# Patient Record
Sex: Male | Born: 1990 | Race: White | Hispanic: No | Marital: Single | Smoking: Current every day smoker
Health system: Southern US, Community
[De-identification: ages and names within clinical notes are randomized; demographics above are authoritative.]

## PROBLEM LIST (undated history)

## (undated) DIAGNOSIS — U071 COVID-19: Secondary | ICD-10-CM

## (undated) HISTORY — DX: COVID-19: U07.1

---
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
## 2013-12-01 ENCOUNTER — Emergency Department: Payer: Self-pay | Admitting: Emergency Medicine

## 2014-01-31 ENCOUNTER — Emergency Department: Payer: Self-pay | Admitting: Emergency Medicine

## 2014-01-31 LAB — CBC
HCT: 42.4 % (ref 40.0–52.0)
HGB: 14.3 g/dL (ref 13.0–18.0)
MCH: 31.7 pg (ref 26.0–34.0)
MCHC: 33.6 g/dL (ref 32.0–36.0)
MCV: 94 fL (ref 80–100)
Platelet: 261 10*3/uL (ref 150–440)
RBC: 4.5 10*6/uL (ref 4.40–5.90)
RDW: 12.9 % (ref 11.5–14.5)
WBC: 7 10*3/uL (ref 3.8–10.6)

## 2014-01-31 LAB — COMPREHENSIVE METABOLIC PANEL
ANION GAP: 5 — AB (ref 7–16)
AST: 36 U/L (ref 15–37)
Albumin: 3.9 g/dL (ref 3.4–5.0)
Alkaline Phosphatase: 62 U/L
BILIRUBIN TOTAL: 0.4 mg/dL (ref 0.2–1.0)
BUN: 16 mg/dL (ref 7–18)
CALCIUM: 8 mg/dL — AB (ref 8.5–10.1)
CO2: 26 mmol/L (ref 21–32)
Chloride: 109 mmol/L — ABNORMAL HIGH (ref 98–107)
Creatinine: 1.06 mg/dL (ref 0.60–1.30)
EGFR (African American): >60
EGFR (Non-African Amer.): >60
Glucose: 97 mg/dL (ref 65–99)
OSMOLALITY: 281 (ref 275–301)
POTASSIUM: 3.5 mmol/L (ref 3.5–5.1)
SGPT (ALT): 34 U/L (ref 12–78)
SODIUM: 140 mmol/L (ref 136–145)
Total Protein: 7.3 g/dL (ref 6.4–8.2)

## 2014-01-31 LAB — URINALYSIS, COMPLETE
BACTERIA: NONE SEEN
BILIRUBIN, UR: NEGATIVE
Blood: NEGATIVE
GLUCOSE, UR: NEGATIVE mg/dL (ref 0–75)
KETONE: NEGATIVE
LEUKOCYTE ESTERASE: NEGATIVE
Nitrite: NEGATIVE
Ph: 6 (ref 4.5–8.0)
Protein: NEGATIVE
RBC,UR: NONE SEEN /HPF (ref 0–5)
SPECIFIC GRAVITY: 1.014 (ref 1.003–1.030)
Squamous Epithelial: NONE SEEN
WBC UR: NONE SEEN /HPF (ref 0–5)

## 2014-01-31 LAB — DRUG SCREEN, URINE
Amphetamines, Ur Screen: NEGATIVE (ref ?–1000)
BARBITURATES, UR SCREEN: NEGATIVE (ref ?–200)
BENZODIAZEPINE, UR SCRN: NEGATIVE (ref ?–200)
COCAINE METABOLITE, UR ~~LOC~~: NEGATIVE (ref ?–300)
Cannabinoid 50 Ng, Ur ~~LOC~~: POSITIVE (ref ?–50)
MDMA (Ecstasy)Ur Screen: NEGATIVE (ref ?–500)
Methadone, Ur Screen: NEGATIVE (ref ?–300)
Opiate, Ur Screen: NEGATIVE (ref ?–300)
PHENCYCLIDINE (PCP) UR S: NEGATIVE (ref ?–25)
Tricyclic, Ur Screen: NEGATIVE (ref ?–1000)

## 2014-01-31 LAB — SALICYLATE LEVEL: Salicylates, Serum: <1.7 mg/dL

## 2014-01-31 LAB — ETHANOL
Ethanol %: 0.21 % — ABNORMAL HIGH (ref 0.000–0.080)
Ethanol: 210 mg/dL

## 2014-01-31 LAB — ACETAMINOPHEN LEVEL: Acetaminophen: <2 ug/mL

## 2014-07-24 ENCOUNTER — Emergency Department: Payer: Self-pay | Admitting: Emergency Medicine

## 2014-10-23 ENCOUNTER — Emergency Department: Payer: Self-pay | Admitting: Emergency Medicine

## 2014-10-26 ENCOUNTER — Emergency Department: Payer: Self-pay | Admitting: Emergency Medicine

## 2014-10-26 LAB — CBC WITH DIFFERENTIAL/PLATELET
BASOS PCT: 0.2 %
Basophil #: 0 10*3/uL (ref 0.0–0.1)
EOS PCT: 0.8 %
Eosinophil #: 0.1 10*3/uL (ref 0.0–0.7)
HCT: 43.7 % (ref 40.0–52.0)
HGB: 14.6 g/dL (ref 13.0–18.0)
LYMPHS ABS: 1.6 10*3/uL (ref 1.0–3.6)
LYMPHS PCT: 12.5 %
MCH: 31.3 pg (ref 26.0–34.0)
MCHC: 33.5 g/dL (ref 32.0–36.0)
MCV: 93 fL (ref 80–100)
MONO ABS: 1.5 x10 3/mm — AB (ref 0.2–1.0)
MONOS PCT: 11.7 %
NEUTROS ABS: 9.6 10*3/uL — AB (ref 1.4–6.5)
NEUTROS PCT: 74.8 %
PLATELETS: 360 10*3/uL (ref 150–440)
RBC: 4.68 10*6/uL (ref 4.40–5.90)
RDW: 12.5 % (ref 11.5–14.5)
WBC: 12.8 10*3/uL — ABNORMAL HIGH (ref 3.8–10.6)

## 2014-10-26 LAB — COMPREHENSIVE METABOLIC PANEL
ANION GAP: 7 (ref 7–16)
Albumin: 3.5 g/dL (ref 3.4–5.0)
Alkaline Phosphatase: 161 U/L — ABNORMAL HIGH
BILIRUBIN TOTAL: 0.5 mg/dL (ref 0.2–1.0)
BUN: 13 mg/dL (ref 7–18)
CHLORIDE: 103 mmol/L (ref 98–107)
CO2: 29 mmol/L (ref 21–32)
Calcium, Total: 8.9 mg/dL (ref 8.5–10.1)
Creatinine: 0.99 mg/dL (ref 0.60–1.30)
EGFR (Non-African Amer.): >60
Glucose: 86 mg/dL (ref 65–99)
Osmolality: 277 (ref 275–301)
POTASSIUM: 3.8 mmol/L (ref 3.5–5.1)
SGOT(AST): 48 U/L — ABNORMAL HIGH (ref 15–37)
SGPT (ALT): 83 U/L — ABNORMAL HIGH
Sodium: 139 mmol/L (ref 136–145)
Total Protein: 8.2 g/dL (ref 6.4–8.2)

## 2014-12-20 ENCOUNTER — Emergency Department: Payer: Self-pay | Admitting: Internal Medicine

## 2015-08-23 ENCOUNTER — Encounter: Payer: Self-pay | Admitting: Emergency Medicine

## 2015-08-23 ENCOUNTER — Emergency Department: Payer: Self-pay

## 2015-08-23 ENCOUNTER — Emergency Department
Admission: EM | Admit: 2015-08-23 | Discharge: 2015-08-23 | Disposition: A | Discharge status: Home / Self Care | Payer: Self-pay | Attending: Emergency Medicine | Admitting: Emergency Medicine

## 2015-08-23 DIAGNOSIS — S7011XA Contusion of right thigh, initial encounter: Secondary | ICD-10-CM | POA: Insufficient documentation

## 2015-08-23 DIAGNOSIS — M7918 Myalgia, other site: Secondary | ICD-10-CM

## 2015-08-23 DIAGNOSIS — Y9389 Activity, other specified: Secondary | ICD-10-CM | POA: Insufficient documentation

## 2015-08-23 DIAGNOSIS — S199XXA Unspecified injury of neck, initial encounter: Secondary | ICD-10-CM | POA: Insufficient documentation

## 2015-08-23 DIAGNOSIS — S40021A Contusion of right upper arm, initial encounter: Secondary | ICD-10-CM | POA: Insufficient documentation

## 2015-08-23 DIAGNOSIS — Y998 Other external cause status: Secondary | ICD-10-CM | POA: Insufficient documentation

## 2015-08-23 DIAGNOSIS — S299XXA Unspecified injury of thorax, initial encounter: Secondary | ICD-10-CM | POA: Insufficient documentation

## 2015-08-23 DIAGNOSIS — Z720 Tobacco use: Secondary | ICD-10-CM | POA: Insufficient documentation

## 2015-08-23 DIAGNOSIS — Y92410 Unspecified street and highway as the place of occurrence of the external cause: Secondary | ICD-10-CM | POA: Insufficient documentation

## 2015-08-23 MED ORDER — MELOXICAM 15 MG PO TABS: 15.0000 mg | ORAL_TABLET | Freq: Every day | ORAL | Status: DC

## 2015-08-23 MED ORDER — CYCLOBENZAPRINE HCL 10 MG PO TABS: 10.0000 mg | ORAL_TABLET | Freq: Three times a day (TID) | ORAL | Status: DC | PRN

## 2015-08-23 NOTE — ED Notes (Signed)
AAOx3.  Skin warm and dry.  Moving all extremities equally and strong. Ambulates with easy and steady gait.   
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
## 2015-11-14 ENCOUNTER — Encounter: Payer: Self-pay | Admitting: Emergency Medicine

## 2015-11-14 ENCOUNTER — Emergency Department
Admission: EM | Admit: 2015-11-14 | Discharge: 2015-11-14 | Disposition: A | Discharge status: Home / Self Care | Payer: Self-pay | Attending: Student | Admitting: Student

## 2015-11-14 DIAGNOSIS — J039 Acute tonsillitis, unspecified: Secondary | ICD-10-CM | POA: Insufficient documentation

## 2015-11-14 DIAGNOSIS — Z791 Long term (current) use of non-steroidal anti-inflammatories (NSAID): Secondary | ICD-10-CM | POA: Insufficient documentation

## 2015-11-14 DIAGNOSIS — F17200 Nicotine dependence, unspecified, uncomplicated: Secondary | ICD-10-CM | POA: Insufficient documentation

## 2015-11-14 MED ORDER — PREDNISONE 10 MG PO TABS: 50.0000 mg | ORAL_TABLET | Freq: Every day | ORAL | Status: DC

## 2015-11-14 MED ORDER — ACETAMINOPHEN-CODEINE 120-12 MG/5ML PO SUSP: 10.0000 mL | Freq: Four times a day (QID) | ORAL | Status: AC | PRN

## 2015-11-14 MED ORDER — AMOXICILLIN 500 MG PO TABS: 500.0000 mg | ORAL_TABLET | Freq: Three times a day (TID) | ORAL | Status: DC

## 2015-11-14 NOTE — ED Provider Notes (Signed)
[facility] Emergency Department Provider Note  ____________________________________________  Time seen: Approximately 11:46 AM  I have reviewed the triage vital signs and the nursing notes.   HISTORY  Chief Complaint Sore Throat   HPI Erik Mooney is a 25 y.o. male who presents to the emergency department for evaluation of sore throat. He states symptoms started about 10 days ago. He has been treating it with Benadryl and Dayquil. Yesterday, symptoms worsened and he has had fever and chills. Pain radiates into the right ear. He has a history of peritonsillar abscess that he "didn't get treated."  History reviewed. No pertinent past medical history.  There are no active problems to display for this patient.   History reviewed. No pertinent past surgical history.  Current Outpatient Rx  Name  Route  Sig  Dispense  Refill  . acetaminophen-codeine 120-12 MG/5ML suspension   Oral   Take 10 mLs by mouth every 6 (six) hours as needed for pain.   120 mL   0   . amoxicillin (AMOXIL) 500 MG tablet   Oral   Take 1 tablet (500 mg total) by mouth 3 (three) times daily.   30 tablet   0   . cyclobenzaprine (FLEXERIL) 10 MG tablet   Oral   Take 1 tablet (10 mg total) by mouth 3 (three) times daily as needed for muscle spasms.   30 tablet   0   . meloxicam (MOBIC) 15 MG tablet   Oral   Take 1 tablet (15 mg total) by mouth daily.   30 tablet   2   . predniSONE (DELTASONE) 10 MG tablet   Oral   Take 5 tablets (50 mg total) by mouth daily.   25 tablet   0     Allergies Review of patient's allergies indicates no known allergies.  History reviewed. No pertinent family history.  Social History Social History  Substance Use Topics  . Smoking status: Current Every Day Smoker  . Smokeless tobacco: None  . Alcohol Use: Yes    Review of Systems Constitutional: Positive for fever. Eyes: No visual changes. ENT: Positive for sore throat; Positive  for difficulty swallowing. Respiratory: Denies shortness of breath. Gastrointestinal: No abdominal pain.  No nausea, no vomiting.  No diarrhea.  Genitourinary: Negative for dysuria. Musculoskeletal: Positive for generalized body aches. Skin: Negative for rash. Neurological: Negative for headaches, focal weakness or numbness.  10-point ROS otherwise negative.  ____________________________________________   PHYSICAL EXAM:  VITAL SIGNS: ED Triage Vitals  Enc Vitals Group     BP 11/14/15 0952 139/72 mmHg     Pulse Rate 11/14/15 0952 104     Resp 11/14/15 0952 18     Temp 11/14/15 0952 99 F (37.2 C)     Temp Source 11/14/15 0952 Oral     SpO2 11/14/15 0952 97 %     Weight 11/14/15 0952 195 lb (88.451 kg)     Height 11/14/15 0952  (1.88 m)     Head Cir --      Peak Flow --      Pain Score 11/14/15 1000 7     Pain Loc --      Pain Edu? --      Excl. in GC? --     Constitutional: Alert and oriented. Well appearing and in no acute distress. Eyes: Conjunctivae are normal. PERRL. EOMI. Head: Atraumatic. Nose: No congestion/rhinnorhea. Mouth/Throat: Mucous membranes are moist.  Oropharynx erythematous, with tonsillar exudate and pustules  bilaterally. Uvula midline. Neck: No stridor. Speech normal. Lymphatic: Lymphadenopathy: present anterior cervical. Cardiovascular: Normal rate, regular rhythm. Good peripheral circulation. Respiratory: Normal respiratory effort. Lungs CTAB. Gastrointestinal: Soft and nontender. Musculoskeletal: No lower extremity tenderness nor edema.   Neurologic:  Normal speech and language. No gross focal neurologic deficits are appreciated. Speech is normal. No gait instability. Skin:  Skin is warm, dry and intact. No rash noted Psychiatric: Mood and affect are normal. Speech and behavior are normal.  ____________________________________________   LABS (all labs ordered are listed, but only abnormal results are displayed)  Labs Reviewed - No data  to display ____________________________________________  EKG   ____________________________________________  RADIOLOGY  Not indicated. ____________________________________________   PROCEDURES  Procedure(s) performed: None  Critical Care performed: No  ____________________________________________   INITIAL IMPRESSION / ASSESSMENT AND PLAN / ED COURSE  Pertinent labs & imaging results that were available during my care of the patient were reviewed by me and considered in my medical decision making (see chart for details).  Patient will take Amoxicillin, prednisone, and tylenol #3.  Patient was advised to follow up with PCP for symptoms that are not improving over the next 2 days. He was  also advised to return to the ER for symptoms that change or worsen if unable to schedule an appointment.  ____________________________________________   FINAL CLINICAL IMPRESSION(S) / ED DIAGNOSES  Final diagnoses:  Acute tonsillitis, unspecified etiology      Chinita Pester, FNP 11/14/15 1558  Gayla Doss, MD 11/14/15 [phone]

## 2015-11-14 NOTE — ED Notes (Signed)
Pt c/o sore throat that started yesterday and has been getting.  Hard to talk and swallow r/t pain per pt. Starting to have pain in right ear.

## 2015-11-14 NOTE — Discharge Instructions (Signed)
Tonsillitis °Tonsillitis is an infection of the throat. This infection causes the tonsils to become red, tender, and puffy (swollen). Tonsils are groups of tissue at the back of your throat. If bacteria caused your infection, antibiotic medicine will be given to you. Sometimes symptoms of tonsillitis can be relieved with the use of steroid medicine. If your tonsillitis is severe and happens often, you may need to get your tonsils removed (tonsillectomy). °HOME CARE  °· Rest and sleep often. °· Drink enough fluids to keep your pee (urine) clear or pale yellow. °· While your throat is sore, eat soft or liquid foods like: °¨ Soup. °¨ Ice cream. °¨ Instant breakfast drinks. °· Eat frozen ice pops. °· Gargle with a warm or cold liquid to help soothe the throat. Gargle with a water and salt mix. Mix 1/4 teaspoon of salt and 1/4 teaspoon of baking soda in 1 cup of water. °· Only take medicines as told by your doctor. °· If you are given medicines (antibiotics), take them as told. Finish them even if you start to feel better. °GET HELP IF: °· You have large, tender lumps in your neck. °· You have a rash. °· You cough up green, yellow-brown, or bloody fluid. °· You cannot swallow liquids or food for 24 hours. °· You notice that only one of your tonsils is swollen. °GET HELP RIGHT AWAY IF:  °· You throw up (vomit). °· You have a very bad headache. °· You have a stiff neck. °· You have chest pain. °· You have trouble breathing or swallowing. °· You have bad throat pain, drooling, or your voice changes. °· You have bad pain not helped by medicine. °· You cannot fully open your mouth. °· You have redness, puffiness, or bad pain in the neck. °· You have a fever. °MAKE SURE YOU:  °· Understand these instructions. °· Will watch your condition. °· Will get help right away if you are not doing well or get worse. °  °This information is not intended to replace advice given to you by your health care provider. Make sure you discuss any  questions you have with your health care provider. °  °Document Released: 03/27/2008 Document Revised: 10/14/2013 Document Reviewed: 03/28/2013 °Elsevier Interactive Patient Education ©2016 [employer] Inc. ° °

## 2015-11-20 ENCOUNTER — Emergency Department
Admission: EM | Admit: 2015-11-20 | Discharge: 2015-11-20 | Disposition: A | Discharge status: Home / Self Care | Payer: Self-pay | Attending: Emergency Medicine | Admitting: Emergency Medicine

## 2015-11-20 DIAGNOSIS — F17200 Nicotine dependence, unspecified, uncomplicated: Secondary | ICD-10-CM | POA: Insufficient documentation

## 2015-11-20 DIAGNOSIS — Z7952 Long term (current) use of systemic steroids: Secondary | ICD-10-CM | POA: Insufficient documentation

## 2015-11-20 DIAGNOSIS — Z791 Long term (current) use of non-steroidal anti-inflammatories (NSAID): Secondary | ICD-10-CM | POA: Insufficient documentation

## 2015-11-20 DIAGNOSIS — Z792 Long term (current) use of antibiotics: Secondary | ICD-10-CM | POA: Insufficient documentation

## 2015-11-20 DIAGNOSIS — J36 Peritonsillar abscess: Secondary | ICD-10-CM

## 2015-11-20 MED ORDER — ONDANSETRON 4 MG PO TBDP
ORAL_TABLET | ORAL | Status: AC
  Administered 2015-11-20: 4 mg via ORAL
  Filled 2015-11-20: qty 1

## 2015-11-20 MED ORDER — ONDANSETRON 4 MG PO TBDP
4.0000 mg | ORAL_TABLET | Freq: Once | ORAL | Status: AC
  Administered 2015-11-20: 4 mg via ORAL

## 2015-11-20 MED ORDER — DEXAMETHASONE SODIUM PHOSPHATE 10 MG/ML IJ SOLN
10.0000 mg | Freq: Once | INTRAMUSCULAR | Status: AC
  Administered 2015-11-20: 10 mg via INTRAMUSCULAR

## 2015-11-20 MED ORDER — DEXAMETHASONE SODIUM PHOSPHATE 10 MG/ML IJ SOLN
INTRAMUSCULAR | Status: AC
  Administered 2015-11-20: 10 mg via INTRAMUSCULAR
  Filled 2015-11-20: qty 1

## 2015-11-20 MED ORDER — AZITHROMYCIN 500 MG PO TABS: 500.0000 mg | ORAL_TABLET | Freq: Every day | ORAL | Status: AC

## 2015-11-20 NOTE — ED Notes (Signed)
Patient reports sore throat for approx 9-10 days.  Seen this past Sunday and diagnosed with strep throat.  Reports he took medications as prescribed, but continued pain.  Patient reports history of peritonsillar abscess.  Throat red and swollen especially on the right.

## 2015-11-20 NOTE — Discharge Instructions (Signed)
Peritonsillar Abscess °A peritonsillar abscess is a collection of yellowish-white fluid (pus) in the back of the throat behind the tonsils. It usually occurs when an infection of the throat or tonsils (tonsillitis) spreads into the tissues around the tonsils. °CAUSES °The infection that leads to a peritonsillar abscess is usually caused by streptococcal bacteria.  °SIGNS AND SYMPTOMS °· Sore throat, often with pain on just one side. °· Swelling and tenderness of the glands (lymph nodes) in the neck. °· Difficulty swallowing. °· Difficulty opening your mouth. °· Fever. °· Chills. °· Drooling because of difficulty swallowing saliva. °· Headache. °· Changes in your voice. °· Bad breath. °DIAGNOSIS °Your health care provider will take your medical history and do a physical exam. Imaging tests may be done, such as an ultrasound or CT scan. A sample of pus may be removed from the abscess using a needle (needle aspiration) or by swabbing the back of your throat. This sample will be sent to a lab for testing. °TREATMENT °Treatment usually involves draining the pus from the abscess. This may be done through needle aspiration or by making an incision in the abscess. You will also likely need to take antibiotic medicine. °HOME CARE INSTRUCTIONS °· Rest as much as possible and get plenty of sleep. °· Take medicines only as directed by your health care provider. °· If you were prescribed an antibiotic medicine, finish it all even if you start to feel better. °· If your abscess was drained by your health care provider, gargle with a mixture of salt and warm water: °¨ Mix 1 tsp of salt in 8 oz of warm water. °¨ Gargle with this mixture four times per day or as needed for comfort. °¨ Do not swallow this mixture. °· Drink plenty of fluids. °· While your throat is sore, eat soft or liquid foods, such as frozen ice pops and ice cream. °· Keep all follow-up visits as directed by your health care provider. This is important. °SEEK  MEDICAL CARE IF: °· You have increased pain, swelling, redness, or drainage in your throat. °· You develop a headache, a lack of energy (lethargy), or generalized feelings of illness. °· You have a fever. °· You feel dizzy. °· You have difficulty swallowing or eating. °· You show signs of becoming dehydrated, such as: °¨ Light-headedness when standing. °¨ Decreased urine output. °¨ A fast heart rate. °¨ Dry mouth. °SEEK IMMEDIATE MEDICAL CARE IF:  °· You have difficulty talking or breathing, or you find it easier to breathe when you lean forward. °· You are coughing up blood or vomiting blood. °· You have severe throat pain that is not helped by medicines. °· You start to drool. °  °This information is not intended to replace advice given to you by your health care provider. Make sure you discuss any questions you have with your health care provider. °  °Document Released: 10/09/2005 Document Revised: 10/30/2014 Document Reviewed: 05/25/2014 °Elsevier Interactive Patient Education ©2016 [employer] Inc. ° °

## 2015-11-20 NOTE — ED Notes (Signed)
Attempted iv access x 1, unsuccessful

## 2015-11-20 NOTE — ED Provider Notes (Signed)
[facility] Emergency Department Provider Note  ____________________________________________  Time seen: On arrival  I have reviewed the triage vital signs and the nursing notes.   HISTORY  Chief Complaint Sore Throat    HPI Erik Mooney is a 25 y.o. male who presents with a sore throat. He reports it has been this way for approximately 9-10 days. He was treated with antibiotics 6 days ago and reports he completed the course. He does have history of a peritonsillar abscess. He denies diplopia breathing. No fevers or chills.    No past medical history on file.  There are no active problems to display for this patient.   No past surgical history on file.  Current Outpatient Rx  Name  Route  Sig  Dispense  Refill  . acetaminophen-codeine 120-12 MG/5ML suspension   Oral   Take 10 mLs by mouth every 6 (six) hours as needed for pain.   120 mL   0   . amoxicillin (AMOXIL) 500 MG tablet   Oral   Take 1 tablet (500 mg total) by mouth 3 (three) times daily.   30 tablet   0   . azithromycin (ZITHROMAX) 500 MG tablet   Oral   Take 1 tablet (500 mg total) by mouth daily. Take 1 tablet daily for 3 days.   7 tablet   0   . cyclobenzaprine (FLEXERIL) 10 MG tablet   Oral   Take 1 tablet (10 mg total) by mouth 3 (three) times daily as needed for muscle spasms.   30 tablet   0   . meloxicam (MOBIC) 15 MG tablet   Oral   Take 1 tablet (15 mg total) by mouth daily.   30 tablet   2   . predniSONE (DELTASONE) 10 MG tablet   Oral   Take 5 tablets (50 mg total) by mouth daily.   25 tablet   0     Allergies Review of patient's allergies indicates no known allergies.  No family history on file.  Social History Social History  Substance Use Topics  . Smoking status: Current Every Day Smoker  . Smokeless tobacco: Not on file  . Alcohol Use: Yes    Review of Systems  Constitutional: Negative for fever. Eyes: Negative for visual  changes. ENT: As above for sore throat    Musculoskeletal: Negative for neck pain Skin: Negative for rash. Neurological: Negative for headaches    ____________________________________________   PHYSICAL EXAM:  VITAL SIGNS: ED Triage Vitals  Enc Vitals Group     BP 11/20/15 2023 145/84 mmHg     Pulse Rate 11/20/15 2023 90     Resp 11/20/15 2023 20     Temp 11/20/15 2023 98.1 F (36.7 C)     Temp Source 11/20/15 2023 Oral     SpO2 11/20/15 2023 95 %     Weight 11/20/15 2023 195 lb (88.451 kg)     Height 11/20/15 2023  (1.88 m)     Head Cir --      Peak Flow --      Pain Score 11/20/15 2024 8     Pain Loc --      Pain Edu? --      Excl. in GC? --     Constitutional: Alert and oriented. Well appearing and in no distress. Eyes: Conjunctivae are normal.  ENT   Head: Normocephalic and atraumatic.   Mouth/Throat: Mucous membranes are moist. Patient with significant erythema peritonsillar area  on the right with bulging suspicious for PTA  Cardiovascular: Normal rate, regular rhythm.  Respiratory: Normal respiratory effort without tachypnea nor retractions.    Neurologic:  Normal speech and language.  Skin:  Skin is warm, dry and intact. No rash noted. Psychiatric: Mood and affect are normal. Patient exhibits appropriate insight and judgment.  ____________________________________________    LABS (pertinent positives/negatives)  Labs Reviewed - No data to display  ____________________________________________     ____________________________________________    RADIOLOGY I have personally reviewed any xrays that were ordered on this patient: None  ____________________________________________   PROCEDURES  Procedure(s) performed: none   ____________________________________________   INITIAL IMPRESSION / ASSESSMENT AND PLAN / ED COURSE  Pertinent labs & imaging results that were available during my care of the patient were reviewed by me  and considered in my medical decision making (see chart for details).  As I was examining the patient he actually started draining purulent fluid from the right PTA without any intervention on my part. This actually provided him significant relief. We did give him 10 mg of IM Decadron another round of antibiotics and have asked him to follow up with ENT.  ____________________________________________   FINAL CLINICAL IMPRESSION(S) / ED DIAGNOSES  Final diagnoses:  Peritonsillar abscess     Jene Every, MD 11/20/15 2309

## 2016-07-02 ENCOUNTER — Emergency Department: Payer: Commercial Managed Care - HMO

## 2016-07-02 ENCOUNTER — Encounter: Payer: Self-pay | Admitting: Emergency Medicine

## 2016-07-02 ENCOUNTER — Emergency Department
Admission: EM | Admit: 2016-07-02 | Discharge: 2016-07-02 | Disposition: A | Discharge status: Home / Self Care | Payer: Commercial Managed Care - HMO | Attending: Emergency Medicine | Admitting: Emergency Medicine

## 2016-07-02 DIAGNOSIS — Z791 Long term (current) use of non-steroidal anti-inflammatories (NSAID): Secondary | ICD-10-CM | POA: Diagnosis not present

## 2016-07-02 DIAGNOSIS — R109 Unspecified abdominal pain: Secondary | ICD-10-CM | POA: Diagnosis not present

## 2016-07-02 DIAGNOSIS — Z7952 Long term (current) use of systemic steroids: Secondary | ICD-10-CM | POA: Diagnosis not present

## 2016-07-02 DIAGNOSIS — Y9351 Activity, roller skating (inline) and skateboarding: Secondary | ICD-10-CM | POA: Diagnosis not present

## 2016-07-02 DIAGNOSIS — Y92330 Ice skating rink (indoor) (outdoor) as the place of occurrence of the external cause: Secondary | ICD-10-CM | POA: Insufficient documentation

## 2016-07-02 DIAGNOSIS — Z792 Long term (current) use of antibiotics: Secondary | ICD-10-CM | POA: Diagnosis not present

## 2016-07-02 DIAGNOSIS — Y999 Unspecified external cause status: Secondary | ICD-10-CM | POA: Insufficient documentation

## 2016-07-02 DIAGNOSIS — R319 Hematuria, unspecified: Secondary | ICD-10-CM | POA: Diagnosis not present

## 2016-07-02 DIAGNOSIS — F17210 Nicotine dependence, cigarettes, uncomplicated: Secondary | ICD-10-CM | POA: Insufficient documentation

## 2016-07-02 LAB — URINALYSIS COMPLETE WITH MICROSCOPIC (ARMC ONLY)
BACTERIA UA: NONE SEEN
Bilirubin Urine: NEGATIVE
Glucose, UA: NEGATIVE mg/dL
KETONES UR: NEGATIVE mg/dL
LEUKOCYTES UA: NEGATIVE
Nitrite: NEGATIVE
PH: 6 (ref 5.0–8.0)
PROTEIN: NEGATIVE mg/dL
SQUAMOUS EPITHELIAL / LPF: NONE SEEN
Specific Gravity, Urine: 1.015 (ref 1.005–1.030)

## 2016-07-02 LAB — COMPREHENSIVE METABOLIC PANEL
ALT: 30 U/L (ref 17–63)
ANION GAP: 8 (ref 5–15)
AST: 23 U/L (ref 15–41)
Albumin: 4.8 g/dL (ref 3.5–5.0)
Alkaline Phosphatase: 57 U/L (ref 38–126)
BUN: 10 mg/dL (ref 6–20)
CHLORIDE: 103 mmol/L (ref 101–111)
CO2: 27 mmol/L (ref 22–32)
Calcium: 9.2 mg/dL (ref 8.9–10.3)
Creatinine, Ser: 0.91 mg/dL (ref 0.61–1.24)
Glucose, Bld: 95 mg/dL (ref 65–99)
POTASSIUM: 4.2 mmol/L (ref 3.5–5.1)
SODIUM: 138 mmol/L (ref 135–145)
Total Bilirubin: 0.9 mg/dL (ref 0.3–1.2)
Total Protein: 7.8 g/dL (ref 6.5–8.1)

## 2016-07-02 LAB — CBC
HEMATOCRIT: 46.4 % (ref 40.0–52.0)
HEMOGLOBIN: 16.2 g/dL (ref 13.0–18.0)
MCH: 32.3 pg (ref 26.0–34.0)
MCHC: 34.9 g/dL (ref 32.0–36.0)
MCV: 92.5 fL (ref 80.0–100.0)
PLATELETS: 268 10*3/uL (ref 150–440)
RBC: 5.02 MIL/uL (ref 4.40–5.90)
RDW: 12.8 % (ref 11.5–14.5)
WBC: 11.4 10*3/uL — AB (ref 3.8–10.6)

## 2016-07-02 LAB — LIPASE, BLOOD: LIPASE: 29 U/L (ref 11–51)

## 2016-07-02 MED ORDER — IOPAMIDOL (ISOVUE-300) INJECTION 61%
100.0000 mL | Freq: Once | INTRAVENOUS | Status: AC | PRN
  Administered 2016-07-02: 100 mL via INTRAVENOUS

## 2016-07-02 NOTE — ED Notes (Signed)
Report to angela, rn. 

## 2016-07-02 NOTE — ED Notes (Signed)
Pt alert and oriented X4, active, cooperative, pt in NAD. RR even and unlabored, color WNL.  Pt informed to return if any life threatening symptoms occur.   

## 2016-07-02 NOTE — ED Notes (Signed)
Pt states yesterday he fell off a skateboard approx 6 feet landing on his back. Pt states after injury he has been uncomfortable, has had hematuria, "my stomach just doesn't feel right", left flank pain and left mid back pain. Skin pwd, resps unlabored. Pt denies loc or head injury. Pt denies nausea, vomiting. Pt states "it feels like it's more hard to pee."

## 2016-07-02 NOTE — Discharge Instructions (Signed)
Please seek medical attention for any high fevers, chest pain, shortness of breath, change in behavior, persistent vomiting, bloody stool or any other new or concerning symptoms.  

## 2016-07-02 NOTE — ED Triage Notes (Signed)
Patient ambulatory to triage.Patient reports that he fell about 6 feet and landed on his back yesterday. Patient with complaint of back and abdominal pain since the fall.

## 2016-07-02 NOTE — ED Notes (Signed)
Patient transported to CT 

## 2016-07-02 NOTE — ED Notes (Signed)
Call bell at side, pt changed into a hospital gown and ct procedure explained to pt.

## 2016-07-02 NOTE — ED Notes (Signed)
Pt says his stomach just "feels weird"; says he has "a red tint" to his urine; pt says he was skateboarding when he fell; has not been able to sleep tonight as he can't get comfortable due to back and abd pain

## 2016-07-02 NOTE — ED Provider Notes (Signed)
[facility] Emergency Department Provider Note   First MD Initiated Contact with Patient 07/02/16 [phone]     (approximate)  I have reviewed the triage vital signs and the nursing notes.   HISTORY  Chief Complaint Fall; Back Pain; and Abdominal Pain    HPI Erik Mooney is a 25 y.o. male presents with history of falling approximately 6 feet onto his back while rollerblading yesterday afternoon at a skateboard park at 2:30 in the afternoon. Patient admits to 7 out of 10 bilateral flank pain and hematuria noted at home. Patient denies any head injury no loss of consciousness no neck pain.   Past medical history None There are no active problems to display for this patient.   Past surgical history None  Prior to Admission medications   Medication Sig Start Date End Date Taking? Authorizing Provider  acetaminophen-codeine 120-12 MG/5ML suspension Take 10 mLs by mouth every 6 (six) hours as needed for pain. 11/14/15 11/13/16  Chinita Pesterari B Triplett, FNP  amoxicillin (AMOXIL) 500 MG tablet Take 1 tablet (500 mg total) by mouth 3 (three) times daily. 11/14/15   Chinita Pesterari B Triplett, FNP  cyclobenzaprine (FLEXERIL) 10 MG tablet Take 1 tablet (10 mg total) by mouth 3 (three) times daily as needed for muscle spasms. 08/23/15   Chinita Pesterari B Triplett, FNP  meloxicam (MOBIC) 15 MG tablet Take 1 tablet (15 mg total) by mouth daily. 08/23/15   Chinita Pesterari B Triplett, FNP  predniSONE (DELTASONE) 10 MG tablet Take 5 tablets (50 mg total) by mouth daily. 11/14/15   Chinita Pesterari B Triplett, FNP    Allergies No known drug allergies History reviewed. No pertinent family history.  Social History Social History  Substance Use Topics  . Smoking status: Current Every Day Smoker    Packs/day: 0.50    Types: Cigarettes  . Smokeless tobacco: Never Used  . Alcohol use Yes    Review of Systems Constitutional: No fever/chills Eyes: No visual changes. ENT: No sore throat. Cardiovascular: Denies chest  pain. Respiratory: Denies shortness of breath. Gastrointestinal: No abdominal pain.  No nausea, no vomiting.  No diarrhea.  No constipation. Genitourinary: Negative for dysuria. Musculoskeletal: Positive for back pain. Skin: Negative for rash. Neurological: Negative for headaches, focal weakness or numbness.  10-point ROS otherwise negative.  ____________________________________________   PHYSICAL EXAM:  VITAL SIGNS: ED Triage Vitals  Enc Vitals Group     BP 07/02/16 0546 (!) 153/74     Pulse Rate 07/02/16 0546 65     Resp 07/02/16 0546 18     Temp 07/02/16 0546 97.9 F (36.6 C)     Temp Source 07/02/16 0546 Oral     SpO2 07/02/16 0546 100 %     Weight 07/02/16 0547 215 lb (97.5 kg)     Height 07/02/16 0547 6\' 2"  (1.88 m)     Head Circumference --      Peak Flow --      Pain Score 07/02/16 0547 7     Pain Loc --      Pain Edu? --      Excl. in GC? --     Constitutional: Alert and oriented. Well appearing and in no acute distress. Eyes: Conjunctivae are normal. PERRL. EOMI. Head: Atraumatic. Ears:  Healthy appearing ear canals and TMs bilaterally Nose: No congestion/rhinnorhea. Mouth/Throat: Mucous membranes are moist.  Oropharynx non-erythematous.  Neck: No stridor.  No meningeal signs.  No cervical spine tenderness to palpation. Cardiovascular: Normal rate, regular rhythm. Good peripheral  circulation. Grossly normal heart sounds. Respiratory: Normal respiratory effort.  No retractions. Lungs CTAB. Gastrointestinal: Soft and nontender. No distention.  Musculoskeletal: No lower extremity tenderness nor edema. No gross deformities of extremities. Neurologic:  Normal speech and language. No gross focal neurologic deficits are appreciated.  Skin:  Skin is warm, dry and intact. No rash noted. Psychiatric: Mood and affect are normal. Speech and behavior are normal.  ____________________________________________   LABS (all labs ordered are listed, but only abnormal  results are displayed)  Labs Reviewed  CBC - Abnormal; Notable for the following:       Result Value   WBC 11.4 (*)    All other components within normal limits  URINALYSIS COMPLETEWITH MICROSCOPIC ([facility] ONLY) - Abnormal; Notable for the following:    Color, Urine YELLOW (*)    APPearance CLEAR (*)    Hgb urine dipstick 1+ (*)    All other components within normal limits  LIPASE, BLOOD  COMPREHENSIVE METABOLIC PANEL    RADIOLOGY I, [place] Jhaden Pizzuto, personally viewed and evaluated these images (plain radiographs) as part of my medical decision making, as well as reviewing the written report by the radiologist.   No results found.    Procedures     INITIAL IMPRESSION / ASSESSMENT AND PLAN / ED COURSE  Pertinent labs & imaging results that were available during my care of the patient were reviewed by me and considered in my medical decision making (see chart for details).  Patient's care transferred to Dr. Derrill Kay awaiting CT scan to evaluate for intra-abdominal/pelvic pathology.   Clinical Course    ____________________________________________  FINAL CLINICAL IMPRESSION(S) / ED DIAGNOSES  Final diagnoses:  None  Hematuria Back pain   MEDICATIONS GIVEN DURING THIS VISIT:  Medications - No data to display   NEW OUTPATIENT MEDICATIONS STARTED DURING THIS VISIT:  New Prescriptions   No medications on file    Modified Medications   No medications on file    Discontinued Medications   No medications on file     Note:  This document was prepared using Dragon voice recognition software and may include unintentional dictation errors.    Darci Current, MD 07/02/16 [phone]

## 2016-07-02 NOTE — ED Provider Notes (Signed)
-----------------------------------------   7:38 AM on 07/02/2016 -----------------------------------------  CT abd/pel IMPRESSION: No evidence of traumatic injury to the abdomen or pelvis.  Discussed this with the patient. Feel he is appropriate for discharge with outpatient follow up at this point. Will give patient urology follow up information.   Phineas SemenGraydon Carie Kapuscinski, MD 07/02/16 [phone]

## 2016-09-20 ENCOUNTER — Emergency Department: Payer: Commercial Managed Care - HMO

## 2016-09-20 ENCOUNTER — Emergency Department
Admission: EM | Admit: 2016-09-20 | Discharge: 2016-09-20 | Disposition: A | Discharge status: Home / Self Care | Payer: Commercial Managed Care - HMO | Attending: Emergency Medicine | Admitting: Emergency Medicine

## 2016-09-20 DIAGNOSIS — J36 Peritonsillar abscess: Secondary | ICD-10-CM | POA: Diagnosis not present

## 2016-09-20 DIAGNOSIS — Z79899 Other long term (current) drug therapy: Secondary | ICD-10-CM | POA: Insufficient documentation

## 2016-09-20 DIAGNOSIS — F17210 Nicotine dependence, cigarettes, uncomplicated: Secondary | ICD-10-CM | POA: Insufficient documentation

## 2016-09-20 DIAGNOSIS — J029 Acute pharyngitis, unspecified: Secondary | ICD-10-CM | POA: Diagnosis present

## 2016-09-20 LAB — BASIC METABOLIC PANEL
ANION GAP: 8 (ref 5–15)
BUN: 11 mg/dL (ref 6–20)
CALCIUM: 8.8 mg/dL — AB (ref 8.9–10.3)
CO2: 26 mmol/L (ref 22–32)
CREATININE: 0.99 mg/dL (ref 0.61–1.24)
Chloride: 102 mmol/L (ref 101–111)
GFR calc Af Amer: >60 mL/min (ref >60)
GLUCOSE: 97 mg/dL (ref 65–99)
Potassium: 3.8 mmol/L (ref 3.5–5.1)
Sodium: 136 mmol/L (ref 135–145)

## 2016-09-20 LAB — CBC WITH DIFFERENTIAL/PLATELET
BASOS ABS: 0 10*3/uL (ref 0–0.1)
BASOS PCT: 0 %
EOS ABS: 0 10*3/uL (ref 0–0.7)
EOS PCT: 0 %
HCT: 43.4 % (ref 40.0–52.0)
Hemoglobin: 14.9 g/dL (ref 13.0–18.0)
Lymphocytes Relative: 10 %
Lymphs Abs: 1.4 10*3/uL (ref 1.0–3.6)
MCH: 30.9 pg (ref 26.0–34.0)
MCHC: 34.3 g/dL (ref 32.0–36.0)
MCV: 90.3 fL (ref 80.0–100.0)
MONO ABS: 1.5 10*3/uL — AB (ref 0.2–1.0)
MONOS PCT: 10 %
Neutro Abs: 11 10*3/uL — ABNORMAL HIGH (ref 1.4–6.5)
Neutrophils Relative %: 80 %
PLATELETS: 261 10*3/uL (ref 150–440)
RBC: 4.81 MIL/uL (ref 4.40–5.90)
RDW: 12.8 % (ref 11.5–14.5)
WBC: 14 10*3/uL — ABNORMAL HIGH (ref 3.8–10.6)

## 2016-09-20 MED ORDER — HYDROCODONE-ACETAMINOPHEN 5-325 MG PO TABS: 1.0000 | ORAL_TABLET | Freq: Three times a day (TID) | ORAL | 0 refills | Status: DC | PRN

## 2016-09-20 MED ORDER — DEXAMETHASONE SODIUM PHOSPHATE 4 MG/ML IJ SOLN
12.0000 mg | Freq: Once | INTRAMUSCULAR | Status: AC
  Administered 2016-09-20: 12 mg via INTRAVENOUS
  Filled 2016-09-20: qty 3

## 2016-09-20 MED ORDER — IOPAMIDOL (ISOVUE-300) INJECTION 61%
75.0000 mL | Freq: Once | INTRAVENOUS | Status: AC | PRN
  Administered 2016-09-20: 75 mL via INTRAVENOUS
  Filled 2016-09-20: qty 75

## 2016-09-20 MED ORDER — PREDNISONE 20 MG PO TABS: 20.0000 mg | ORAL_TABLET | Freq: Two times a day (BID) | ORAL | 0 refills | Status: AC

## 2016-09-20 MED ORDER — KETOROLAC TROMETHAMINE 30 MG/ML IJ SOLN
30.0000 mg | Freq: Once | INTRAMUSCULAR | Status: AC
  Administered 2016-09-20: 30 mg via INTRAVENOUS
  Filled 2016-09-20: qty 1

## 2016-09-20 MED ORDER — SODIUM CHLORIDE 0.9 % IV SOLN
3.0000 g | Freq: Once | INTRAVENOUS | Status: AC
  Administered 2016-09-20: 3 g via INTRAVENOUS
  Filled 2016-09-20: qty 3

## 2016-09-20 MED ORDER — AMOXICILLIN-POT CLAVULANATE 875-125 MG PO TABS: 1.0000 | ORAL_TABLET | Freq: Two times a day (BID) | ORAL | 0 refills | Status: DC

## 2016-09-20 MED ORDER — SODIUM CHLORIDE 0.9 % IV BOLUS (SEPSIS)
1000.0000 mL | Freq: Once | INTRAVENOUS | Status: AC
  Administered 2016-09-20: 1000 mL via INTRAVENOUS

## 2016-09-20 NOTE — ED Provider Notes (Signed)
[facility] Emergency Department Provider Note ____________________________________________  Time seen: 1301  I have reviewed the triage vital signs and the nursing notes.  HISTORY  Chief Complaint  Sore Throat  HPI Erik Mooney is a 25 y.o. male presents to the ED forevaluation of sore throat and swelling for the last 3 days. Patient has a history of tonsillitis and peritonsillar abscess in the past. He is able to control secretions and is able to talk in full sentences. He describes fullness to the distal throat as well as pain with swallowing. He reports fever yesterday and been afebrile since.  History reviewed. No pertinent past medical history.  There are no active problems to display for this patient.  History reviewed. No pertinent surgical history.  Prior to Admission medications   Medication Sig Start Date End Date Taking? Authorizing Provider  acetaminophen-codeine 120-12 MG/5ML suspension Take 10 mLs by mouth every 6 (six) hours as needed for pain. 11/14/15 11/13/16  Chinita Pesterari B Triplett, FNP  amoxicillin (AMOXIL) 500 MG tablet Take 1 tablet (500 mg total) by mouth 3 (three) times daily. 11/14/15   Chinita Pesterari B Triplett, FNP  amoxicillin-clavulanate (AUGMENTIN) 875-125 MG tablet Take 1 tablet by mouth 2 (two) times daily. 09/20/16   Yarieliz Wasser V Bacon Arya Boxley, PA-C  cyclobenzaprine (FLEXERIL) 10 MG tablet Take 1 tablet (10 mg total) by mouth 3 (three) times daily as needed for muscle spasms. 08/23/15   Chinita Pesterari B Triplett, FNP  HYDROcodone-acetaminophen (NORCO) 5-325 MG tablet Take 1 tablet by mouth 3 (three) times daily as needed. 09/20/16   Mahagony Grieb V Bacon Senia Even, PA-C  meloxicam (MOBIC) 15 MG tablet Take 1 tablet (15 mg total) by mouth daily. 08/23/15   Chinita Pesterari B Triplett, FNP  predniSONE (DELTASONE) 20 MG tablet Take 1 tablet (20 mg total) by mouth 2 (two) times daily with a meal. 09/20/16 09/25/16  Keyerra Lamere V Bacon Latrell Reitan, PA-C    Allergies Patient has no known  allergies.  No family history on file.  Social History Social History  Substance Use Topics  . Smoking status: Current Every Day Smoker    Packs/day: 0.50    Types: Cigarettes  . Smokeless tobacco: Never Used  . Alcohol use Yes    Review of Systems  Constitutional: Positive for fever. Eyes: Negative for visual changes. ENT: Positive for sore throat. Cardiovascular: Negative for chest pain. Respiratory: Negative for shortness of breath. Gastrointestinal: Negative for abdominal pain, vomiting and diarrhea. Skin: Negative for rash. Neurological: Negative for headaches, focal weakness or numbness. ____________________________________________  PHYSICAL EXAM:  VITAL SIGNS: ED Triage Vitals [09/20/16 1138]  Enc Vitals Group     BP (!) 140/56     Pulse Rate (!) 110     Resp 20     Temp 98.6 F (37 C)     Temp Source Oral     SpO2 96 %     Weight 205 lb (93 kg)     Height 6\' 2"  (1.88 m)     Head Circumference      Peak Flow      Pain Score 9     Pain Loc      Pain Edu?      Excl. in GC?     Constitutional: Alert and oriented. Well appearing and in no distress. Head: Normocephalic and atraumatic. Eyes: Conjunctivae are normal. PERRL. Normal extraocular movements Ears: Canals clear. TMs intact bilaterally. Nose: No congestion/rhinorrhea/epistaxis. Mouth/Throat: Mucous membranes are moist. Uvula is midline but there is subtle fullness  to the right peritonsillar region when compared to the left. Tonsils are 3+ with erythema and exudate bilaterally. Neck: Supple. No thyromegaly. Hematological/Lymphatic/Immunological: No cervical lymphadenopathy. Cardiovascular: Normal rate, regular rhythm. Normal distal pulses. Respiratory: Normal respiratory effort. No wheezes/rales/rhonchi. Gastrointestinal: Soft and nontender. No distention. Skin:  Skin is warm, dry and intact. No rash noted. Psychiatric: Mood and affect are normal. Patient exhibits appropriate insight and  judgment. ____________________________________________   LABS (pertinent positives/negatives) Labs Reviewed  BASIC METABOLIC PANEL - Abnormal; Notable for the following:       Result Value   Calcium 8.8 (*)    All other components within normal limits  CBC WITH DIFFERENTIAL/PLATELET - Abnormal; Notable for the following:    WBC 14.0 (*)    Neutro Abs 11.0 (*)    Monocytes Absolute 1.5 (*)    All other components within normal limits  ____________________________________________   RADIOLOGY  CT Neck w/ Contrast IMPRESSION: Probable inflammatory enlargement of the adenoids and tonsils. 1.5 cm low-density region deep to the tonsils on the right consistent with peri tonsillar phlegmonous inflammation or early abscess. No evidence of deep space extension. Mild reactive level 2 and level 3 nodal enlargement without suppuration. ____________________________________________  PROCEDURES  NS 1000 ml  Toradol 30 mg IVP Decadron 12 mg IVP Unasyn 300 mg IVPB ____________________________________________  INITIAL IMPRESSION / ASSESSMENT AND PLAN / ED COURSE  ----------------------------------------- 4:06 PM on 09/20/2016 ----------------------------------------- S/w Dr. Willeen CassBennett: he agrees with the treatment plan of IV antibiotics and steroid in the ED. The patient will be discharged with prescriptions for amoxicillin, prednisone, and Norco. He will be strongly encouraged to follow-up with ENT for follow-up and management.   Clinical Course    ____________________________________________  FINAL CLINICAL IMPRESSION(S) / ED DIAGNOSES  Final diagnoses:  Peritonsillar abscess      Lissa HoardJenise V Bacon Lenin Kuhnle, PA-C 09/20/16 1643    Governor Rooksebecca Lord, MD 09/21/16 0740

## 2016-09-20 NOTE — Discharge Instructions (Signed)
You MUST take the antibiotic as directed until all pills are gone. You MUST follow-up with ENT for recheck and any further management. Take the steroid medicine as directed and the pain medicine as needed. Rinse with warm salty water and change your toothbrush in 24 hours. Return to the ED immediately for severe swelling, difficulty swallowing, or inability to swallow your spit.

## 2016-09-20 NOTE — ED Notes (Signed)
Red painful throat with yellow splotches. Slight swelling right neck. No resp. Issues.

## 2016-09-20 NOTE — ED Notes (Signed)
Pt reports feels like throat more swollen. Jenise PA notified.

## 2016-09-20 NOTE — ED Triage Notes (Signed)
Sore throat and swelling X 3 days. Hx of same every year. Pt controlling secretions, talking in full and complete sentences. Pt alert and oriented X4, active, cooperative, pt in NAD. RR even and unlabored, color WNL.

## 2016-09-20 NOTE — ED Notes (Signed)
Pt verbalized understanding of discharge instructions. NAD at this time. 

## 2018-04-20 ENCOUNTER — Other Ambulatory Visit: Payer: Self-pay | Admitting: Orthopedic Surgery

## 2018-04-20 DIAGNOSIS — S83512A Sprain of anterior cruciate ligament of left knee, initial encounter: Secondary | ICD-10-CM

## 2018-04-20 DIAGNOSIS — M25562 Pain in left knee: Secondary | ICD-10-CM

## 2018-04-20 DIAGNOSIS — S82832A Other fracture of upper and lower end of left fibula, initial encounter for closed fracture: Secondary | ICD-10-CM

## 2018-04-20 DIAGNOSIS — M25362 Other instability, left knee: Secondary | ICD-10-CM

## 2018-05-02 ENCOUNTER — Ambulatory Visit
Admission: RE | Admit: 2018-05-02 | Discharge: 2018-05-02 | Disposition: A | Discharge status: Home / Self Care | Payer: 59 | Source: Ambulatory Visit | Attending: Orthopedic Surgery | Admitting: Orthopedic Surgery

## 2018-05-02 DIAGNOSIS — S82832A Other fracture of upper and lower end of left fibula, initial encounter for closed fracture: Secondary | ICD-10-CM

## 2018-05-02 DIAGNOSIS — S83512A Sprain of anterior cruciate ligament of left knee, initial encounter: Secondary | ICD-10-CM

## 2018-05-02 DIAGNOSIS — M25562 Pain in left knee: Secondary | ICD-10-CM

## 2018-05-02 DIAGNOSIS — M25362 Other instability, left knee: Secondary | ICD-10-CM

## 2018-07-21 DIAGNOSIS — S83512A Sprain of anterior cruciate ligament of left knee, initial encounter: Secondary | ICD-10-CM | POA: Insufficient documentation

## 2018-07-21 DIAGNOSIS — F17200 Nicotine dependence, unspecified, uncomplicated: Secondary | ICD-10-CM | POA: Insufficient documentation

## 2019-07-24 HISTORY — PX: KNEE SURGERY: SHX244

## 2020-09-30 ENCOUNTER — Emergency Department: Payer: No Typology Code available for payment source

## 2020-09-30 ENCOUNTER — Other Ambulatory Visit: Payer: Self-pay

## 2020-09-30 ENCOUNTER — Emergency Department
Admission: EM | Admit: 2020-09-30 | Discharge: 2020-09-30 | Disposition: A | Discharge status: Home / Self Care | Payer: No Typology Code available for payment source | Attending: Emergency Medicine | Admitting: Emergency Medicine

## 2020-09-30 DIAGNOSIS — F17210 Nicotine dependence, cigarettes, uncomplicated: Secondary | ICD-10-CM | POA: Insufficient documentation

## 2020-09-30 DIAGNOSIS — M25512 Pain in left shoulder: Secondary | ICD-10-CM | POA: Insufficient documentation

## 2020-09-30 DIAGNOSIS — S161XXA Strain of muscle, fascia and tendon at neck level, initial encounter: Secondary | ICD-10-CM | POA: Diagnosis not present

## 2020-09-30 DIAGNOSIS — S39012A Strain of muscle, fascia and tendon of lower back, initial encounter: Secondary | ICD-10-CM | POA: Diagnosis not present

## 2020-09-30 DIAGNOSIS — S8992XA Unspecified injury of left lower leg, initial encounter: Secondary | ICD-10-CM | POA: Diagnosis present

## 2020-09-30 DIAGNOSIS — S8012XA Contusion of left lower leg, initial encounter: Secondary | ICD-10-CM | POA: Insufficient documentation

## 2020-09-30 MED ORDER — MELOXICAM 7.5 MG PO TABS
15.0000 mg | ORAL_TABLET | Freq: Once | ORAL | Status: AC
  Administered 2020-09-30: 15 mg via ORAL
  Filled 2020-09-30: qty 2

## 2020-09-30 MED ORDER — METHOCARBAMOL 500 MG PO TABS
1000.0000 mg | ORAL_TABLET | Freq: Once | ORAL | Status: AC
  Administered 2020-09-30: 1000 mg via ORAL
  Filled 2020-09-30: qty 2

## 2020-09-30 MED ORDER — MELOXICAM 15 MG PO TABS: 15.0000 mg | ORAL_TABLET | Freq: Every day | ORAL | 0 refills | Status: DC

## 2020-09-30 MED ORDER — METHOCARBAMOL 500 MG PO TABS: 500.0000 mg | ORAL_TABLET | Freq: Four times a day (QID) | ORAL | 0 refills | Status: DC

## 2020-09-30 NOTE — ED Notes (Signed)
Patient transported to X-ray 
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
## 2020-10-15 ENCOUNTER — Emergency Department
Admission: EM | Admit: 2020-10-15 | Discharge: 2020-10-15 | Disposition: A | Discharge status: Home / Self Care | Payer: HRSA Program | Attending: Emergency Medicine | Admitting: Emergency Medicine

## 2020-10-15 ENCOUNTER — Other Ambulatory Visit: Payer: Self-pay

## 2020-10-15 DIAGNOSIS — Y92410 Unspecified street and highway as the place of occurrence of the external cause: Secondary | ICD-10-CM | POA: Diagnosis not present

## 2020-10-15 DIAGNOSIS — M549 Dorsalgia, unspecified: Secondary | ICD-10-CM | POA: Insufficient documentation

## 2020-10-15 DIAGNOSIS — R109 Unspecified abdominal pain: Secondary | ICD-10-CM | POA: Diagnosis present

## 2020-10-15 DIAGNOSIS — U071 COVID-19: Secondary | ICD-10-CM | POA: Insufficient documentation

## 2020-10-15 DIAGNOSIS — F17210 Nicotine dependence, cigarettes, uncomplicated: Secondary | ICD-10-CM | POA: Diagnosis not present

## 2020-10-15 LAB — URINALYSIS, COMPLETE (UACMP) WITH MICROSCOPIC
Bilirubin Urine: NEGATIVE
Glucose, UA: NEGATIVE mg/dL
Hgb urine dipstick: NEGATIVE
Ketones, ur: NEGATIVE mg/dL
Leukocytes,Ua: NEGATIVE
Nitrite: NEGATIVE
Protein, ur: 100 mg/dL — AB
Specific Gravity, Urine: 1.025 (ref 1.005–1.030)
Squamous Epithelial / LPF: NONE SEEN (ref 0–5)
pH: 7 (ref 5.0–8.0)

## 2020-10-15 LAB — CBC
HCT: 45 % (ref 39.0–52.0)
Hemoglobin: 15.3 g/dL (ref 13.0–17.0)
MCH: 30.8 pg (ref 26.0–34.0)
MCHC: 34 g/dL (ref 30.0–36.0)
MCV: 90.7 fL (ref 80.0–100.0)
Platelets: 284 10*3/uL (ref 150–400)
RBC: 4.96 MIL/uL (ref 4.22–5.81)
RDW: 11.6 % (ref 11.5–15.5)
WBC: 5.8 10*3/uL (ref 4.0–10.5)
nRBC: 0 % (ref 0.0–0.2)

## 2020-10-15 LAB — COMPREHENSIVE METABOLIC PANEL
ALT: 43 U/L (ref 0–44)
AST: 27 U/L (ref 15–41)
Albumin: 4.5 g/dL (ref 3.5–5.0)
Alkaline Phosphatase: 71 U/L (ref 38–126)
Anion gap: 11 (ref 5–15)
BUN: 9 mg/dL (ref 6–20)
CO2: 28 mmol/L (ref 22–32)
Calcium: 9 mg/dL (ref 8.9–10.3)
Chloride: 97 mmol/L — ABNORMAL LOW (ref 98–111)
Creatinine, Ser: 0.9 mg/dL (ref 0.61–1.24)
GFR, Estimated: >60 mL/min (ref >60)
Glucose, Bld: 102 mg/dL — ABNORMAL HIGH (ref 70–99)
Potassium: 3.7 mmol/L (ref 3.5–5.1)
Sodium: 136 mmol/L (ref 135–145)
Total Bilirubin: 0.5 mg/dL (ref 0.3–1.2)
Total Protein: 7.9 g/dL (ref 6.5–8.1)

## 2020-10-15 LAB — RESP PANEL BY RT-PCR (FLU A&B, COVID) ARPGX2
Influenza A by PCR: NEGATIVE
Influenza B by PCR: NEGATIVE
SARS Coronavirus 2 by RT PCR: POSITIVE — AB

## 2020-10-15 LAB — LIPASE, BLOOD: Lipase: 24 U/L (ref 11–51)

## 2020-10-15 MED ORDER — SODIUM CHLORIDE 0.9 % IV BOLUS
1000.0000 mL | Freq: Once | INTRAVENOUS | Status: AC
  Administered 2020-10-15: 1000 mL via INTRAVENOUS

## 2020-10-15 MED ORDER — DICYCLOMINE HCL 10 MG PO CAPS: 10.0000 mg | ORAL_CAPSULE | Freq: Three times a day (TID) | ORAL | 0 refills | Status: DC | PRN

## 2020-10-15 MED ORDER — ALBUTEROL SULFATE HFA 108 (90 BASE) MCG/ACT IN AERS: 2.0000 | INHALATION_SPRAY | Freq: Four times a day (QID) | RESPIRATORY_TRACT | 0 refills | Status: DC | PRN

## 2020-10-15 MED ORDER — HALOPERIDOL LACTATE 5 MG/ML IJ SOLN
5.0000 mg | Freq: Once | INTRAMUSCULAR | Status: AC
  Administered 2020-10-15: 5 mg via INTRAVENOUS
  Filled 2020-10-15: qty 1

## 2020-10-15 MED ORDER — ONDANSETRON HCL 4 MG PO TABS: 4.0000 mg | ORAL_TABLET | Freq: Three times a day (TID) | ORAL | 0 refills | Status: DC | PRN

## 2020-10-15 NOTE — ED Triage Notes (Signed)
PT to ED c/o abd pain, side pain, back pain and nausea. States came on all of a sudden while he was at work.   PT states constipation, last BM 3 or 4 days.

## 2020-10-15 NOTE — Discharge Instructions (Signed)
Please seek medical attention for any high fevers, chest pain, shortness of breath, change in behavior, persistent vomiting, bloody stool or any other new or concerning symptoms.  
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
## 2020-10-15 NOTE — ED Notes (Signed)
Pt sitting in wheelchair in no acute distress.  
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
## 2020-10-17 ENCOUNTER — Telehealth: Payer: Self-pay | Admitting: Physician Assistant

## 2020-10-17 LAB — URINE CULTURE: Culture: NO GROWTH

## 2020-10-17 NOTE — Telephone Encounter (Signed)
Called to Discuss with patient about Covid symptoms and the use of the monoclonal antibody infusion for those with mild to moderate Covid symptoms and at a high risk of hospitalization.     Pt appears to qualify for this infusion due to co-morbid conditions and/or a member of an at-risk group in accordance with the FDA Emergency Use Authorization.    Unable to reach pt. No voice mail.    

## 2020-11-23 NOTE — Progress Notes (Signed)
No show

## 2020-11-24 ENCOUNTER — Ambulatory Visit (INDEPENDENT_AMBULATORY_CARE_PROVIDER_SITE_OTHER): Payer: Self-pay | Admitting: Adult Health

## 2020-11-24 DIAGNOSIS — Z5329 Procedure and treatment not carried out because of patient's decision for other reasons: Secondary | ICD-10-CM

## 2020-12-21 ENCOUNTER — Ambulatory Visit: Payer: Self-pay | Admitting: Adult Health

## 2020-12-21 ENCOUNTER — Other Ambulatory Visit: Payer: Self-pay

## 2021-01-03 ENCOUNTER — Ambulatory Visit (INDEPENDENT_AMBULATORY_CARE_PROVIDER_SITE_OTHER): Payer: Self-pay | Admitting: Adult Health

## 2021-01-03 DIAGNOSIS — Z5329 Procedure and treatment not carried out because of patient's decision for other reasons: Secondary | ICD-10-CM

## 2021-01-03 NOTE — Progress Notes (Signed)
No show for appointment was over 10 minutes late without paperwork.

## 2021-01-25 ENCOUNTER — Ambulatory Visit: Payer: Self-pay | Admitting: Adult Health

## 2021-02-14 ENCOUNTER — Ambulatory Visit: Payer: Self-pay | Admitting: Family Medicine

## 2021-02-21 ENCOUNTER — Other Ambulatory Visit: Payer: Self-pay

## 2021-02-21 ENCOUNTER — Ambulatory Visit (INDEPENDENT_AMBULATORY_CARE_PROVIDER_SITE_OTHER): Payer: Self-pay | Admitting: Family Medicine

## 2021-02-21 ENCOUNTER — Encounter: Payer: Self-pay | Admitting: Family Medicine

## 2021-02-21 VITALS — BP 126/84 | HR 94 | Temp 98.5°F | Ht 74.0 in | Wt 253.0 lb

## 2021-02-21 DIAGNOSIS — R400 Somnolence: Secondary | ICD-10-CM

## 2021-02-21 DIAGNOSIS — Z720 Tobacco use: Secondary | ICD-10-CM

## 2021-02-21 DIAGNOSIS — Z716 Tobacco abuse counseling: Secondary | ICD-10-CM
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
## 2021-02-21 NOTE — Patient Instructions (Addendum)
-   Sleep Medicine will contact you to coordinate visit - Follow-up as-needed

## 2022-05-19 ENCOUNTER — Other Ambulatory Visit: Payer: Self-pay

## 2022-05-19 ENCOUNTER — Emergency Department
Admission: EM | Admit: 2022-05-19 | Discharge: 2022-05-19 | Disposition: A | Discharge status: Home / Self Care | Payer: Self-pay | Attending: Emergency Medicine | Admitting: Emergency Medicine

## 2022-05-19 DIAGNOSIS — Z720 Tobacco use: Secondary | ICD-10-CM | POA: Insufficient documentation

## 2022-05-19 DIAGNOSIS — H9201 Otalgia, right ear: Secondary | ICD-10-CM

## 2022-05-19 DIAGNOSIS — Z8616 Personal history of COVID-19: Secondary | ICD-10-CM | POA: Insufficient documentation

## 2022-05-19 DIAGNOSIS — H60331 Swimmer's ear, right ear: Secondary | ICD-10-CM

## 2022-05-19 MED ORDER — LIDOCAINE HCL (PF) 1 % IJ SOLN
2.0000 mL | Freq: Once | INTRAMUSCULAR | Status: AC
  Administered 2022-05-19: 2 mL
  Filled 2022-05-19: qty 5

## 2022-05-19 MED ORDER — CIPROFLOXACIN-DEXAMETHASONE 0.3-0.1 % OT SUSP: 4.0000 [drp] | Freq: Two times a day (BID) | OTIC | 0 refills | Status: AC

## 2022-05-19 MED ORDER — CIPROFLOXACIN-DEXAMETHASONE 0.3-0.1 % OT SUSP
4.0000 [drp] | Freq: Once | OTIC | Status: AC
  Administered 2022-05-19: 4 [drp] via OTIC
  Filled 2022-05-19: qty 7.5

## 2022-05-19 NOTE — ED Notes (Signed)
E-signature pad unavailable - Pt verbalized understanding of D/C information - no additional concerns at this time.  
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
## 2022-05-19 NOTE — Discharge Instructions (Addendum)
Apply antibiotic eardrops 4 drops to right ear twice daily x7 days.  Return to the ER for worsening symptoms, persistent vomiting, difficulty breathing or other concerns.

## 2022-05-19 NOTE — ED Triage Notes (Signed)
Pt presents to ER from home c/o right ear pain x3 days.  Pt states he is having some hearing loss in his right ear.  Pt states he went swimming Friday last week.  Pt states pain has moved to right side of his jaw as well.  Pt is A&O x4 at this time in NAD.
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
## 2023-03-06 ENCOUNTER — Telehealth: Payer: Self-pay | Admitting: Physician Assistant

## 2023-03-07 ENCOUNTER — Ambulatory Visit: Payer: Medicaid Other | Admitting: Physician Assistant

## 2023-05-09 DIAGNOSIS — Z20822 Contact with and (suspected) exposure to covid-19: Secondary | ICD-10-CM | POA: Diagnosis not present

## 2023-05-30 ENCOUNTER — Ambulatory Visit
Admission: RE | Admit: 2023-05-30 | Discharge: 2023-05-30 | Disposition: A | Discharge status: Home / Self Care | Payer: 59 | Source: Ambulatory Visit | Attending: Emergency Medicine | Admitting: Emergency Medicine

## 2023-05-30 VITALS — BP 149/85 | HR 75 | Temp 98.1°F | Resp 18

## 2023-05-30 DIAGNOSIS — R300 Dysuria: Secondary | ICD-10-CM

## 2023-05-30 LAB — POCT URINALYSIS DIP (MANUAL ENTRY)
Bilirubin, UA: NEGATIVE
Blood, UA: NEGATIVE
Glucose, UA: NEGATIVE mg/dL
Ketones, POC UA: NEGATIVE mg/dL
Nitrite, UA: NEGATIVE
Protein Ur, POC: NEGATIVE mg/dL
Spec Grav, UA: 1.02 (ref 1.010–1.025)
Urobilinogen, UA: 1 E.U./dL
pH, UA: 8 (ref 5.0–8.0)

## 2023-05-30 MED ORDER — NITROFURANTOIN MONOHYD MACRO 100 MG PO CAPS: 100.0000 mg | ORAL_CAPSULE | Freq: Two times a day (BID) | ORAL | 0 refills | Status: AC

## 2023-05-30 MED ORDER — IBUPROFEN 800 MG PO TABS: 800.0000 mg | ORAL_TABLET | Freq: Three times a day (TID) | ORAL | 0 refills | Status: DC

## 2023-05-30 NOTE — ED Triage Notes (Signed)
Patient to Urgent Care with complaints of painful urination/ urinary urgency. Reports he woke up this morning with difficulty starting a stream. Reports when he was able to void it was painful/ burning.   Denies any concerns for STDs.
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
## 2023-05-30 NOTE — Discharge Instructions (Addendum)
Your urinalysis shows Erik Mooney blood cells but does not currently show bacteria ,  your urine will be sent to the lab to determine exactly which bacteria is present, if any changes need to be made to your medications you will be notified  While men can experience urinary infections due to anatomy this is not common and therefore as a precaution STI testing has been completed, you will be notified of positive test results only in treatment center and at time of notification  Another common cause for urinary symptoms in young man can be kidney stones, if you begin to experience back pain, lower abdominal pain and groin pain, blood in your urine, nausea, persistent vomiting these are concerning and are signs of a worsening kidney stone or obstruction and you need to be evaluated in person the referral please go to the nearest emergency department so that you may receive CT imaging as it is the definitive testing to visualize a kidney stone  Begin use of Macrobid every morning and every evening for 7 days  May use ibuprofen every 8 hours as needed for pain  You may use over-the-counter Pyridium to help minimize your symptoms until antibiotic removes bacteria, this medication will turn your urine orange , most likely the medicine your fianc is giving  Increase your fluid intake through use of water  If symptoms continue to persist after use of medication or recur please follow-up with urgent care or your primary doctor as needed

## 2023-06-10 ENCOUNTER — Ambulatory Visit
Admission: EM | Admit: 2023-06-10 | Discharge: 2023-06-10 | Disposition: A | Discharge status: Home / Self Care | Payer: 59 | Attending: Emergency Medicine | Admitting: Emergency Medicine

## 2023-06-10 ENCOUNTER — Ambulatory Visit (INDEPENDENT_AMBULATORY_CARE_PROVIDER_SITE_OTHER): Payer: 59

## 2023-06-10 DIAGNOSIS — M25561 Pain in right knee: Secondary | ICD-10-CM

## 2023-06-10 DIAGNOSIS — J029 Acute pharyngitis, unspecified: Secondary | ICD-10-CM | POA: Diagnosis not present

## 2023-06-10 DIAGNOSIS — M85861 Other specified disorders of bone density and structure, right lower leg: Secondary | ICD-10-CM | POA: Diagnosis not present

## 2023-06-10 DIAGNOSIS — M1711 Unilateral primary osteoarthritis, right knee: Secondary | ICD-10-CM | POA: Diagnosis not present

## 2023-06-10 DIAGNOSIS — M25461 Effusion, right knee: Secondary | ICD-10-CM | POA: Diagnosis not present

## 2023-06-10 LAB — POCT RAPID STREP A (OFFICE): Rapid Strep A Screen: NEGATIVE

## 2023-06-10 MED ORDER — PHENAZOPYRIDINE HCL 200 MG PO TABS: 200.0000 mg | ORAL_TABLET | Freq: Three times a day (TID) | ORAL | 0 refills | Status: DC

## 2023-06-10 MED ORDER — AZITHROMYCIN 250 MG PO TABS: 250.0000 mg | ORAL_TABLET | Freq: Every day | ORAL | 0 refills | Status: DC

## 2023-06-10 MED ORDER — PREDNISONE 20 MG PO TABS: 40.0000 mg | ORAL_TABLET | Freq: Every day | ORAL | 0 refills | Status: DC

## 2023-06-10 NOTE — Discharge Instructions (Addendum)
Your symptoms today are most likely being caused by a virus and should steadily improve in time it can take up to 7 to 10 days before you truly start to see a turnaround however things will get better  Your strep test was negative for bacteria of the throat, you will be prophylactically placed on azithromycin which is an antibiotic due to the discomfort that you are experiencing    You can take Tylenol and/or Ibuprofen as needed for fever reduction and pain relief.   For cough: honey 1/2 to 1 teaspoon (you can dilute the honey in water or another fluid).  You can also use guaifenesin and dextromethorphan for cough. You can use a humidifier for chest congestion and cough.  If you don't have a humidifier, you can sit in the bathroom with the hot shower running.      For sore throat: try warm salt water gargles, cepacol lozenges, throat spray, warm tea or water with lemon/honey, popsicles or ice, or OTC cold relief medicine for throat discomfort.   For congestion: take a daily anti-histamine like Zyrtec, Claritin, and a oral decongestant, such as pseudoephedrine.  You can also use Flonase 1-2 sprays in each nostril daily.   It is important to stay hydrated: drink plenty of fluids (water, gatorade/powerade/pedialyte, juices, or teas) to keep your throat moisturized and help further relieve irritation/discomfort.   Your knee x-ray is pending, you will be notified of test results via telephone as it can take up to an hour for [place] to receive results  You have been placed in a compression sleeve for stability and support, wear when completing activities until pain has resolved  Begin prednisone every morning with food for 5 days to reduce inflammation, this will help with swelling and pain, may take Tylenol while using medicine, this will also help with your throat pain  May use ice or heat over the affected area in 10 to 15-minute intervals  Elevate whenever sitting and lying to help reduce swelling  and for general comfort  You may continue activity for which you are able to tolerate  If your pain continues to persist please follow-up with orthopedics information is listed on front page

## 2023-06-10 NOTE — ED Triage Notes (Signed)
Sore throat that started yesterday. Pt also states he fell yesterday and twisted his right knee yesterday.

## 2023-06-13 ENCOUNTER — Telehealth: Payer: Self-pay | Admitting: Emergency Medicine

## 2023-06-13 NOTE — Telephone Encounter (Signed)
Attempted to call patient x 1 to x-ray results completed during visit on 06/10/2023, unable to leave voicemail, sent MyChart message
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
## 2023-09-28 ENCOUNTER — Encounter: Payer: Self-pay | Admitting: Family Medicine

## 2023-09-28 ENCOUNTER — Ambulatory Visit (INDEPENDENT_AMBULATORY_CARE_PROVIDER_SITE_OTHER): Payer: 59 | Admitting: Family Medicine

## 2023-09-28 VITALS — BP 120/70 | HR 81 | Ht 74.0 in | Wt 276.2 lb

## 2023-09-28 DIAGNOSIS — Z23 Encounter for immunization: Secondary | ICD-10-CM

## 2023-09-28 DIAGNOSIS — Z1159 Encounter for screening for other viral diseases: Secondary | ICD-10-CM

## 2023-09-28 DIAGNOSIS — R400 Somnolence: Secondary | ICD-10-CM

## 2023-09-28 DIAGNOSIS — M1711 Unilateral primary osteoarthritis, right knee: Secondary | ICD-10-CM | POA: Insufficient documentation

## 2023-09-28 DIAGNOSIS — Z13220 Encounter for screening for lipoid disorders: Secondary | ICD-10-CM

## 2023-09-28 DIAGNOSIS — Z131 Encounter for screening for diabetes mellitus: Secondary | ICD-10-CM

## 2023-09-28 DIAGNOSIS — Z0000 Encounter for general adult medical examination without abnormal findings: Secondary | ICD-10-CM

## 2023-09-28 DIAGNOSIS — Z114 Encounter for screening for human immunodeficiency virus [HIV]: Secondary | ICD-10-CM

## 2023-09-28 MED ORDER — MELOXICAM 15 MG PO TABS: 15.0000 mg | ORAL_TABLET | Freq: Every day | ORAL | 2 refills | Status: AC | PRN

## 2023-09-28 NOTE — Patient Instructions (Signed)
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 We also reviewed your general health maintenance needs.  YOUR PLAN:  -RIGHT KNEE PAIN: We will manage your pain with Meloxicam as needed, and you should take it with food. Additionally, you will perform home exercises for strengthening and stabilization at least once a week for 1-1.5 months. We will follow up in 3 months to assess your progress.  -SLEEP EVALUATION: A sleep evaluation is necessary for your DMV clearance to rule out sleep apnea. We will refer you to a pulmonary/sleep medicine group for an evaluation and possibly a home sleep test.  -GENERAL HEALTH MAINTENANCE: We will conduct routine blood work. Additionally, we will schedule a physical examination in 3 months.  INSTRUCTIONS:  Please follow up in 3 months for your right knee pain and physical examination. In the meantime, take Meloxicam as needed with food, perform the prescribed home exercises, and attend the sleep evaluation with the pulmonary/sleep medicine group.
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
## 2023-09-28 NOTE — Assessment & Plan Note (Addendum)
History of Present Illness The patient, with a history of left knee surgery for ACL repair and meniscus repair, presents with chronic discomfort in the right knee. The patient reports that the right knee does not extend as much as the left knee and tends to keep it bent more often for pain control. The patient also reports transient sharp severe pain in the right knee, particularly during quick movements or strenuous activities. The patient denies any instability or buckling of the knee during walking or simple tasks. The patient also reports minor stiffness in the knee, particularly after sitting for prolonged periods. The patient has previously used a brace for the knee, which provided temporary relief. The patient denies any significant swelling in the knee.  Physical Exam MUSCULOSKELETAL: Right knee inspection reveals no swelling, effusion negative, bilateral patella facets non-tender, extensor mechanism intact, resisted hip flexion elicits anterior-posterior knee pain, ROM 0-130 painless, evidence of dynamic maltracking with passive flexion and extension, anterior & posterior drawer test benign non-tender at medial and lateral joint lines, negative valgus and varus test, McMurray's test localizes pain medially. Single leg squat on right side elicits pain at approximately 70 degrees of flexion. Contralateral benign.  RADIOLOGY Right knee X-ray: Mild arthritis with medial joint space narrowing, large joint effusion, minor osteophytes on patella (06/10/2023)  Right Knee Pain   Patient reports discomfort and minor stiffness, particularly with quick movements and after repeated bending. Physical exam reveals evidence of dynamic maltracking with passive flexion extension and pain during single leg squat at around 70 degrees of flexion. X-ray shows minimal osteoarthritis and osteophytes at the patella, suggesting abnormal patellar tracking.   - Prescribe Meloxicam as needed for pain control, instructing  patient to take with food.   - Provide home exercises for strengthening and stabilization, advising patient to perform them at least once a week for 1-1.5 months.   - Plan for follow-up in 3 months. If no improvement, consider advanced imaging / formal physical therapy.
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
## 2023-11-07 ENCOUNTER — Encounter: Payer: Self-pay | Admitting: Internal Medicine

## 2023-11-07 ENCOUNTER — Ambulatory Visit (INDEPENDENT_AMBULATORY_CARE_PROVIDER_SITE_OTHER): Payer: 59 | Admitting: Internal Medicine

## 2023-11-07 VITALS — BP 132/76 | HR 77 | Temp 97.6°F | Ht 74.0 in | Wt 270.2 lb

## 2023-11-07 DIAGNOSIS — Z720 Tobacco use: Secondary | ICD-10-CM

## 2023-11-07 DIAGNOSIS — R0683 Snoring: Secondary | ICD-10-CM | POA: Diagnosis not present

## 2023-11-07 DIAGNOSIS — G4719 Other hypersomnia: Secondary | ICD-10-CM | POA: Diagnosis not present

## 2023-11-07 DIAGNOSIS — F17210 Nicotine dependence, cigarettes, uncomplicated: Secondary | ICD-10-CM | POA: Diagnosis not present
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
## 2023-11-07 NOTE — Patient Instructions (Signed)
 Recommend home sleep study to assess for sleep apnea  Recommend smoking cessation High risk for stroke high risk for heart attack high risk for lung cancer  Recommend weight loss  Avoid Allergens and Irritants Avoid secondhand smoke Avoid SICK contacts Recommend  Masking  when appropriate Recommend Keep up-to-date with vaccinations

## 2023-12-27 ENCOUNTER — Encounter: Payer: Self-pay | Admitting: Family Medicine

## 2024-02-11 ENCOUNTER — Ambulatory Visit: Payer: 59 | Admitting: Internal Medicine
